# Patient Record
Sex: Female | Born: 1960 | Race: White | Hispanic: No | Marital: Married | Smoking: Never smoker
Health system: Southern US, Community
[De-identification: ages and names within clinical notes are randomized; demographics above are authoritative.]

## PROBLEM LIST (undated history)

## (undated) DIAGNOSIS — E042 Nontoxic multinodular goiter: Secondary | ICD-10-CM

## (undated) DIAGNOSIS — Z9889 Other specified postprocedural states: Secondary | ICD-10-CM

## (undated) DIAGNOSIS — Z8371 Family history of colonic polyps: Secondary | ICD-10-CM

## (undated) DIAGNOSIS — E079 Disorder of thyroid, unspecified: Secondary | ICD-10-CM

## (undated) DIAGNOSIS — K649 Unspecified hemorrhoids: Secondary | ICD-10-CM

## (undated) DIAGNOSIS — K5790 Diverticulosis of intestine, part unspecified, without perforation or abscess without bleeding: Secondary | ICD-10-CM

## (undated) DIAGNOSIS — F419 Anxiety disorder, unspecified: Secondary | ICD-10-CM

## (undated) DIAGNOSIS — Z86018 Personal history of other benign neoplasm: Secondary | ICD-10-CM

## (undated) DIAGNOSIS — K219 Gastro-esophageal reflux disease without esophagitis: Secondary | ICD-10-CM

## (undated) DIAGNOSIS — Z83719 Family history of colon polyps, unspecified: Secondary | ICD-10-CM

## (undated) HISTORY — DX: Gastro-esophageal reflux disease without esophagitis: K21.9

## (undated) HISTORY — DX: Disorder of thyroid, unspecified: E07.9

## (undated) HISTORY — DX: Unspecified hemorrhoids: K64.9

## (undated) HISTORY — DX: Other specified postprocedural states: Z98.890

## (undated) HISTORY — PX: TONSILLECTOMY: SUR1361

## (undated) HISTORY — DX: Nontoxic multinodular goiter: E04.2

## (undated) HISTORY — DX: Anxiety disorder, unspecified: F41.9

## (undated) HISTORY — PX: OTHER SURGICAL HISTORY: SHX169

## (undated) HISTORY — DX: Family history of colon polyps, unspecified: Z83.719

## (undated) HISTORY — DX: Other specified postprocedural states: Z86.018

## (undated) HISTORY — DX: Family history of colonic polyps: Z83.71

## (undated) HISTORY — DX: Diverticulosis of intestine, part unspecified, without perforation or abscess without bleeding: K57.90

---

## 1997-12-19 ENCOUNTER — Other Ambulatory Visit: Admission: RE | Admit: 1997-12-19 | Discharge: 1997-12-19 | Payer: Self-pay | Admitting: Obstetrics and Gynecology

## 1999-01-08 ENCOUNTER — Other Ambulatory Visit: Admission: RE | Admit: 1999-01-08 | Discharge: 1999-01-08 | Payer: Self-pay | Admitting: Obstetrics and Gynecology

## 2000-01-20 ENCOUNTER — Other Ambulatory Visit: Admission: RE | Admit: 2000-01-20 | Discharge: 2000-01-20 | Payer: Self-pay | Admitting: Obstetrics and Gynecology

## 2000-02-04 ENCOUNTER — Encounter: Admission: RE | Admit: 2000-02-04 | Discharge: 2000-02-04 | Payer: Self-pay | Admitting: Obstetrics and Gynecology

## 2000-02-04 ENCOUNTER — Encounter: Payer: Self-pay | Admitting: Obstetrics and Gynecology

## 2000-02-11 ENCOUNTER — Encounter: Payer: Self-pay | Admitting: Obstetrics and Gynecology

## 2000-02-11 ENCOUNTER — Encounter: Admission: RE | Admit: 2000-02-11 | Discharge: 2000-02-11 | Payer: Self-pay | Admitting: Obstetrics and Gynecology

## 2001-02-02 ENCOUNTER — Other Ambulatory Visit: Admission: RE | Admit: 2001-02-02 | Discharge: 2001-02-02 | Payer: Self-pay | Admitting: Obstetrics and Gynecology

## 2001-02-16 ENCOUNTER — Encounter: Payer: Self-pay | Admitting: Obstetrics and Gynecology

## 2001-02-16 ENCOUNTER — Encounter: Admission: RE | Admit: 2001-02-16 | Discharge: 2001-02-16 | Payer: Self-pay | Admitting: Obstetrics and Gynecology

## 2005-08-05 ENCOUNTER — Encounter: Admission: RE | Admit: 2005-08-05 | Discharge: 2005-08-05 | Payer: Self-pay | Admitting: Vascular Surgery

## 2006-08-09 ENCOUNTER — Encounter: Admission: RE | Admit: 2006-08-09 | Discharge: 2006-08-09 | Payer: Self-pay | Admitting: Obstetrics and Gynecology

## 2007-08-11 ENCOUNTER — Encounter: Admission: RE | Admit: 2007-08-11 | Discharge: 2007-08-11 | Payer: Self-pay | Admitting: Vascular Surgery

## 2008-08-15 ENCOUNTER — Encounter: Admission: RE | Admit: 2008-08-15 | Discharge: 2008-08-15 | Payer: Self-pay | Admitting: Sports Medicine

## 2009-09-05 ENCOUNTER — Encounter: Admission: RE | Admit: 2009-09-05 | Discharge: 2009-09-05 | Payer: Self-pay | Admitting: Obstetrics and Gynecology

## 2010-08-25 ENCOUNTER — Other Ambulatory Visit: Payer: Self-pay | Admitting: Obstetrics and Gynecology

## 2010-08-25 DIAGNOSIS — Z1231 Encounter for screening mammogram for malignant neoplasm of breast: Secondary | ICD-10-CM

## 2010-08-25 DIAGNOSIS — N6009 Solitary cyst of unspecified breast: Secondary | ICD-10-CM

## 2010-09-17 ENCOUNTER — Other Ambulatory Visit: Payer: Self-pay | Admitting: Obstetrics and Gynecology

## 2010-09-17 DIAGNOSIS — N6009 Solitary cyst of unspecified breast: Secondary | ICD-10-CM

## 2010-09-19 ENCOUNTER — Ambulatory Visit: Payer: Self-pay

## 2010-09-19 ENCOUNTER — Ambulatory Visit
Admission: RE | Admit: 2010-09-19 | Discharge: 2010-09-19 | Disposition: A | Discharge status: Home / Self Care | Payer: BC Managed Care – PPO | Source: Ambulatory Visit | Attending: Obstetrics and Gynecology | Admitting: Obstetrics and Gynecology

## 2010-09-19 DIAGNOSIS — N6009 Solitary cyst of unspecified breast: Secondary | ICD-10-CM

## 2011-09-28 ENCOUNTER — Other Ambulatory Visit: Payer: Self-pay | Admitting: Obstetrics and Gynecology

## 2011-09-28 DIAGNOSIS — Z1231 Encounter for screening mammogram for malignant neoplasm of breast: Secondary | ICD-10-CM

## 2011-10-14 ENCOUNTER — Ambulatory Visit
Admission: RE | Admit: 2011-10-14 | Discharge: 2011-10-14 | Disposition: A | Discharge status: Home / Self Care | Payer: BC Managed Care – PPO | Source: Ambulatory Visit | Attending: Obstetrics and Gynecology | Admitting: Obstetrics and Gynecology

## 2011-10-14 DIAGNOSIS — Z1231 Encounter for screening mammogram for malignant neoplasm of breast: Secondary | ICD-10-CM

## 2011-10-19 ENCOUNTER — Other Ambulatory Visit: Payer: Self-pay | Admitting: Obstetrics and Gynecology

## 2011-10-19 DIAGNOSIS — R928 Other abnormal and inconclusive findings on diagnostic imaging of breast: Secondary | ICD-10-CM

## 2011-10-20 ENCOUNTER — Ambulatory Visit
Admission: RE | Admit: 2011-10-20 | Discharge: 2011-10-20 | Disposition: A | Discharge status: Home / Self Care | Payer: BC Managed Care – PPO | Source: Ambulatory Visit | Attending: Obstetrics and Gynecology | Admitting: Obstetrics and Gynecology

## 2011-10-20 DIAGNOSIS — R928 Other abnormal and inconclusive findings on diagnostic imaging of breast: Secondary | ICD-10-CM

## 2012-09-29 ENCOUNTER — Other Ambulatory Visit: Payer: Self-pay

## 2012-09-29 DIAGNOSIS — Z1231 Encounter for screening mammogram for malignant neoplasm of breast: Secondary | ICD-10-CM

## 2012-10-24 ENCOUNTER — Ambulatory Visit
Admission: RE | Admit: 2012-10-24 | Discharge: 2012-10-24 | Disposition: A | Discharge status: Home / Self Care | Payer: 59 | Source: Ambulatory Visit

## 2012-10-24 DIAGNOSIS — Z1231 Encounter for screening mammogram for malignant neoplasm of breast: Secondary | ICD-10-CM

## 2013-06-15 HISTORY — PX: COLONOSCOPY: SHX174

## 2013-11-02 ENCOUNTER — Other Ambulatory Visit: Payer: Self-pay

## 2013-11-02 DIAGNOSIS — Z1231 Encounter for screening mammogram for malignant neoplasm of breast: Secondary | ICD-10-CM

## 2013-11-09 ENCOUNTER — Ambulatory Visit
Admission: RE | Admit: 2013-11-09 | Discharge: 2013-11-09 | Disposition: A | Discharge status: Home / Self Care | Payer: 59 | Source: Ambulatory Visit

## 2013-11-09 ENCOUNTER — Encounter (INDEPENDENT_AMBULATORY_CARE_PROVIDER_SITE_OTHER): Payer: Self-pay

## 2013-11-09 DIAGNOSIS — Z1231 Encounter for screening mammogram for malignant neoplasm of breast: Secondary | ICD-10-CM

## 2014-11-09 ENCOUNTER — Other Ambulatory Visit: Payer: Self-pay

## 2014-11-09 DIAGNOSIS — Z1231 Encounter for screening mammogram for malignant neoplasm of breast: Secondary | ICD-10-CM

## 2014-12-06 ENCOUNTER — Ambulatory Visit
Admission: RE | Admit: 2014-12-06 | Discharge: 2014-12-06 | Disposition: A | Discharge status: Home / Self Care | Payer: 59 | Source: Ambulatory Visit

## 2014-12-06 ENCOUNTER — Ambulatory Visit: Payer: Self-pay

## 2014-12-06 DIAGNOSIS — Z1231 Encounter for screening mammogram for malignant neoplasm of breast: Secondary | ICD-10-CM

## 2015-12-16 ENCOUNTER — Other Ambulatory Visit: Payer: Self-pay | Admitting: Obstetrics and Gynecology

## 2015-12-16 DIAGNOSIS — Z1231 Encounter for screening mammogram for malignant neoplasm of breast: Secondary | ICD-10-CM

## 2015-12-30 ENCOUNTER — Ambulatory Visit
Admission: RE | Admit: 2015-12-30 | Discharge: 2015-12-30 | Disposition: A | Discharge status: Home / Self Care | Payer: BC Managed Care – PPO | Source: Ambulatory Visit | Attending: Obstetrics and Gynecology | Admitting: Obstetrics and Gynecology

## 2015-12-30 DIAGNOSIS — Z1231 Encounter for screening mammogram for malignant neoplasm of breast: Secondary | ICD-10-CM

## 2016-11-30 ENCOUNTER — Other Ambulatory Visit: Payer: Self-pay | Admitting: Obstetrics and Gynecology

## 2016-11-30 DIAGNOSIS — Z1231 Encounter for screening mammogram for malignant neoplasm of breast: Secondary | ICD-10-CM

## 2016-12-31 ENCOUNTER — Ambulatory Visit: Payer: BC Managed Care – PPO

## 2017-01-01 ENCOUNTER — Other Ambulatory Visit: Payer: Self-pay | Admitting: Obstetrics and Gynecology

## 2017-01-01 DIAGNOSIS — N6320 Unspecified lump in the left breast, unspecified quadrant: Secondary | ICD-10-CM

## 2017-01-07 ENCOUNTER — Ambulatory Visit
Admission: RE | Admit: 2017-01-07 | Discharge: 2017-01-07 | Disposition: A | Discharge status: Home / Self Care | Payer: BC Managed Care – PPO | Source: Ambulatory Visit | Attending: Obstetrics and Gynecology | Admitting: Obstetrics and Gynecology

## 2017-01-07 DIAGNOSIS — N6320 Unspecified lump in the left breast, unspecified quadrant: Secondary | ICD-10-CM

## 2017-12-08 ENCOUNTER — Other Ambulatory Visit: Payer: Self-pay | Admitting: Obstetrics and Gynecology

## 2017-12-08 DIAGNOSIS — Z1231 Encounter for screening mammogram for malignant neoplasm of breast: Secondary | ICD-10-CM

## 2018-01-10 ENCOUNTER — Ambulatory Visit
Admission: RE | Admit: 2018-01-10 | Discharge: 2018-01-10 | Disposition: A | Discharge status: Home / Self Care | Payer: BC Managed Care – PPO | Source: Ambulatory Visit | Attending: Obstetrics and Gynecology | Admitting: Obstetrics and Gynecology

## 2018-01-10 DIAGNOSIS — Z1231 Encounter for screening mammogram for malignant neoplasm of breast: Secondary | ICD-10-CM

## 2018-09-16 ENCOUNTER — Encounter: Payer: Self-pay | Admitting: General Practice

## 2018-09-16 NOTE — Progress Notes (Signed)
Previous note entered in error.  Santa Genera, LCSW Clinical Social Worker Phone:  [phone]

## 2018-09-16 NOTE — Progress Notes (Deleted)
[facility] Psychosocial Distress Screening Clinical Social Work  Clinical Social Work was referred by distress screening protocol.  The patient scored a 7 on the Psychosocial Distress Thermometer which indicates moderate distress. Clinical Social Worker contacted patient by phone to assess for distress and other psychosocial needs. Unable to reach patient, left VM w information about Support Center and our contact information.  ON[facility] DISTRESS SCREENING 09/16/2018  Screening Type Initial Screening  Distress experienced in past week (1-10) 7  Practical problem type Work/school  Emotional problem type Adjusting to illness  Information Concerns Type Lack of info about diagnosis;Lack of info about treatment;Lack of info about complementary therapy choices;Lack of info about maintaining fitness    Clinical Social Worker follow up needed: No.  If yes, follow up plan:  Sallee Lange, LCSW   Santa Genera, LCSW Clinical Social Worker Phone:  [phone]

## 2018-11-16 ENCOUNTER — Other Ambulatory Visit: Payer: Self-pay | Admitting: Obstetrics and Gynecology

## 2018-11-16 DIAGNOSIS — Z1231 Encounter for screening mammogram for malignant neoplasm of breast: Secondary | ICD-10-CM

## 2018-11-24 ENCOUNTER — Encounter: Payer: Self-pay | Admitting: Gastroenterology

## 2018-11-28 ENCOUNTER — Encounter: Payer: Self-pay | Admitting: Gastroenterology

## 2018-12-08 ENCOUNTER — Ambulatory Visit: Payer: BC Managed Care – PPO | Admitting: *Deleted

## 2018-12-08 ENCOUNTER — Telehealth: Payer: Self-pay | Admitting: *Deleted

## 2018-12-08 ENCOUNTER — Other Ambulatory Visit: Payer: Self-pay

## 2018-12-08 VITALS — Ht 64.0 in | Wt 185.0 lb

## 2018-12-08 DIAGNOSIS — Z1211 Encounter for screening for malignant neoplasm of colon: Secondary | ICD-10-CM

## 2018-12-08 DIAGNOSIS — Z8371 Family history of colonic polyps: Secondary | ICD-10-CM

## 2018-12-08 MED ORDER — SUPREP BOWEL PREP KIT 17.5-3.13-1.6 GM/177ML PO SOLN: 1.0000 | Freq: Once | ORAL | 0 refills | Status: AC

## 2018-12-08 NOTE — Telephone Encounter (Signed)
Pt had a PV today with me- Her last colon was in 2015,  this was her FIRST  Colon per pt- no older reports in there HDA system- - pt had NO polyps- hems and tics ONLY - she has no [facility]- her brother has had colon polyps- - the report from 2015 states the colon was performed due to a hx of colon polyps, but pt NEVER had a colon prior to the 2015 colonoscopy- ? Need colon now- pt does not want to have a bill for a screening colon if she doesnt need the colon now.   Please advise, Thanks for your time Lelan Pons  Pre Visit

## 2018-12-08 NOTE — Progress Notes (Signed)
No egg or soy allergy known to patient  No issues with past sedation with any surgeries  or procedures, no intubation problems  No diet pills per patient No home 02 use per patient  No blood thinners per patient  Pt denies issues with constipation  No A fib or A flutter  EMMI video sent to pt's e mail   Pt verified name, DOB, address and insurance during PV today. Pt mailed instruction packet to included paper to complete and mail back to [facility] with addressed and stamped envelope, Emmi video, copy of consent form to read and not return, and instructions. Suprep $15  coupon mailed in packet. PV completed over the phone. Pt encouraged to call with questions or issues   Pt is aware that care partner will wait in the car during parking lot; if they feel like they will be too hot to wait in the car; they may wait in the lobby.  We want them to wear a mask (we do not have any that we can provide them), practice social distancing, and we will check their temperatures when they get here.  I did remind patient that their care partner needs to stay in the parking lot the entire time. Pt will wear mask into building  Pt had a colon 2015, this was her FIRST  Colon per pt- no older reports - pt had NO polyps- hems and tics- she has no [facility]- her brother has had colon polyps- ? Need colon now- TE to [facility]

## 2018-12-09 NOTE — Telephone Encounter (Signed)
I do remember her Strong family history of colonic polyps Lets do colonoscopy this year.   Thx RG

## 2018-12-12 ENCOUNTER — Encounter: Payer: Self-pay | Admitting: Gastroenterology

## 2018-12-12 NOTE — Telephone Encounter (Signed)
Patient called and given Dr.Gupta's recommendations.

## 2018-12-12 NOTE — Telephone Encounter (Signed)
Patient is following up on previous msg. °

## 2018-12-21 ENCOUNTER — Telehealth: Payer: Self-pay | Admitting: Gastroenterology

## 2018-12-21 NOTE — Telephone Encounter (Signed)
Spoke with patient regarding Covid-19 screening questions °Covid-19 Screening Questions: ° °Do you now or have you had a fever in the last 14 days? no ° °Do you have any respiratory symptoms of shortness of breath or cough now or in the last 14 days? no ° °Do you have any family members or close contacts with diagnosed or suspected Covid-19 in the past 14 days? no ° °Have you been tested for Covid-19 and found to be positive? Yes, negative ° °Pt made aware of that care partner may wait in the car or come up to the lobby during the procedure but will need to provide their own mask. °

## 2018-12-22 ENCOUNTER — Other Ambulatory Visit: Payer: Self-pay

## 2018-12-22 ENCOUNTER — Encounter: Payer: Self-pay | Admitting: Gastroenterology

## 2018-12-22 ENCOUNTER — Ambulatory Visit (AMBULATORY_SURGERY_CENTER): Payer: BC Managed Care – PPO | Admitting: Gastroenterology

## 2018-12-22 VITALS — BP 108/61 | HR 66 | Temp 98.3°F | Resp 12

## 2018-12-22 DIAGNOSIS — Z1211 Encounter for screening for malignant neoplasm of colon: Secondary | ICD-10-CM | POA: Diagnosis not present

## 2018-12-22 DIAGNOSIS — Z83719 Family history of colon polyps, unspecified: Secondary | ICD-10-CM

## 2018-12-22 DIAGNOSIS — Z8371 Family history of colonic polyps: Secondary | ICD-10-CM

## 2018-12-22 MED ORDER — SODIUM CHLORIDE 0.9 % IV SOLN: 500.0000 mL | Freq: Once | INTRAVENOUS | Status: DC

## 2018-12-22 NOTE — Patient Instructions (Signed)
Please read handouts provided. High fiber diet. Continue present medications. Use preparation H cream as needed for hemorrhoids.      YOU HAD AN ENDOSCOPIC PROCEDURE TODAY AT [facility]:   Refer to the procedure report that was given to you for any specific questions about what was found during the examination.  If the procedure report does not answer your questions, please call your gastroenterologist to clarify.  If you requested that your care partner not be given the details of your procedure findings, then the procedure report has been included in a sealed envelope for you to review at your convenience later.  YOU SHOULD EXPECT: Some feelings of bloating in the abdomen. Passage of more gas than usual.  Walking can help get rid of the air that was put into your GI tract during the procedure and reduce the bloating. If you had a lower endoscopy (such as a colonoscopy or flexible sigmoidoscopy) you may notice spotting of blood in your stool or on the toilet paper. If you underwent a bowel prep for your procedure, you may not have a normal bowel movement for a few days.  Please Note:  You might notice some irritation and congestion in your nose or some drainage.  This is from the oxygen used during your procedure.  There is no need for concern and it should clear up in a day or so.  SYMPTOMS TO REPORT IMMEDIATELY:   Following lower endoscopy (colonoscopy or flexible sigmoidoscopy):  Excessive amounts of blood in the stool  Significant tenderness or worsening of abdominal pains  Swelling of the abdomen that is new, acute  Fever of 100F or higher    For urgent or emergent issues, a gastroenterologist can be reached at any hour by calling [phone].   DIET:  We do recommend a small meal at first, but then you may proceed to your regular diet.  Drink plenty of fluids but you should avoid alcoholic beverages for 24 hours.  ACTIVITY:  You should plan to take it easy  for the rest of today and you should NOT DRIVE or use heavy machinery until tomorrow (because of the sedation medicines used during the test).    FOLLOW UP: Our staff will call the number listed on your records 48-72 hours following your procedure to check on you and address any questions or concerns that you may have regarding the information given to you following your procedure. If we do not reach you, we will leave a message.  We will attempt to reach you two times.  During this call, we will ask if you have developed any symptoms of COVID 19. If you develop any symptoms (ie: fever, flu-like symptoms, shortness of breath, cough etc.) before then, please call [phone].  If you test positive for Covid 19 in the 2 weeks post procedure, please call and report this information to [place].    If any biopsies were taken you will be contacted by phone or by letter within the next 1-3 weeks.  Please call us at [phone] if you have not heard about the biopsies in 3 weeks.    SIGNATURES/CONFIDENTIALITY: You and/or your care partner have signed paperwork which will be entered into your electronic medical record.  These signatures attest to the fact that that the information above on your After Visit Summary has been reviewed and is understood.  Full responsibility of the confidentiality of this discharge information lies with you and/or your care-partner.

## 2018-12-22 NOTE — Progress Notes (Signed)
PT taken to PACU. Monitors in place. VSS. Report given to RN. 

## 2018-12-22 NOTE — Op Note (Signed)
[facility] Patient Name: Wyman SongsterLisa O'connell Procedure Date: 12/22/2018 9:18 AM MRN: [id number] Endoscopist: Lynann Bolognaajesh Katerina Zurn , MD Age: 58 Referring MD:  Date of Birth: [birth year] Gender: Female Account #: [id number] Procedure:                Colonoscopy Indications:              Screening for colorectal malignant neoplasm, H/O                            colonic polyps. Medicines:                Monitored Anesthesia Care Procedure:                Pre-Anesthesia Assessment:                           - Prior to the procedure, a History and Physical                            was performed, and patient medications and                            allergies were reviewed. The patient's tolerance of                            previous anesthesia was also reviewed. The risks                            and benefits of the procedure and the sedation                            options and risks were discussed with the patient.                            All questions were answered, and informed consent                            was obtained. Prior Anticoagulants: The patient has                            taken no previous anticoagulant or antiplatelet                            agents. ASA Grade Assessment: I - A normal, healthy                            patient. After reviewing the risks and benefits,                            the patient was deemed in satisfactory condition to                            undergo the procedure.  After obtaining informed consent, the colonoscope                            was passed under direct vision. Throughout the                            procedure, the patient's blood pressure, pulse, and                            oxygen saturations were monitored continuously. The                            Colonoscope was introduced through the anus and                            advanced to the the cecum, identified by           appendiceal orifice and ileocecal valve. The                            colonoscopy was performed without difficulty. The                            patient tolerated the procedure well. The quality                            of the bowel preparation was excellent. The                            ileocecal valve, appendiceal orifice, and rectum                            were photographed. Scope In: 9:24:42 AM Scope Out: 9:39:23 AM Scope Withdrawal Time: 0 hours 9 minutes 49 seconds  Total Procedure Duration: 0 hours 14 minutes 41 seconds  Findings:                 A few small-mouthed diverticula were found in the                            sigmoid colon and ascending colon.                           Non-bleeding external and internal hemorrhoids were                            found during retroflexion and during perianal exam.                            The hemorrhoids were moderate (Gd IV).                           The exam was otherwise without abnormality on                            direct and  retroflexion views. Complications:            No immediate complications. Estimated Blood Loss:     Estimated blood loss: none. Impression:               - Mild pancolonic diverticulosis.                           - Non-bleeding external and internal hemorrhoids.                           - Otherwise normal colonoscopy. Recommendation:           - Patient has a contact number available for                            emergencies. The signs and symptoms of potential                            delayed complications were discussed with the                            patient. Return to normal activities tomorrow.                            Written discharge instructions were provided to the                            patient.                           - High fiber diet.                           - Continue present medications.                           - Repeat colonoscopy in 10 years  for screening                            purposes. Earlier, if with any new problems or                            change in family history.                           - Return to GI clinic PRN.                           - If any problems with hemorrhoids, use Preparation                            H and let us know. I would have low threshold and                            referring her for surgical evaluation for possible  EUA/hemorrhoidectomy in case of any problems. Lynann Bolognaajesh Jareb Radoncic, MD 12/22/2018 9:46:01 AM This report has been signed electronically.

## 2018-12-26 ENCOUNTER — Telehealth: Payer: Self-pay

## 2018-12-26 NOTE — Telephone Encounter (Signed)
  Follow up Call-  Call back number 12/22/2018  Post procedure Call Back phone  # [phone]  Permission to leave phone message Yes  Some recent data might be hidden     Patient questions:  Do you have a fever, pain , or abdominal swelling? No. Pain Score  0 *  Have you tolerated food without any problems? Yes.    Have you been able to return to your normal activities? Yes.    Do you have any questions about your discharge instructions: Diet   No. Medications  No. Follow up visit  No.  Do you have questions or concerns about your Care? No.  Actions: * If pain score is 4 or above: No action needed, pain <4.  1. Have you developed a fever since your procedure? no  2.   Have you had an respiratory symptoms (SOB or cough) since your procedure? no  3.   Have you tested positive for COVID 19 since your procedure no  4.   Have you had any family members/close contacts diagnosed with the COVID 19 since your procedure?  no   If yes to any of these questions please route to Joylene John, RN and Alphonsa Gin, Therapist, sports.

## 2018-12-26 NOTE — Telephone Encounter (Signed)
NO ANSWER, MESSAGE LEFT FOR PATIENT. 

## 2019-01-16 ENCOUNTER — Ambulatory Visit
Admission: RE | Admit: 2019-01-16 | Discharge: 2019-01-16 | Disposition: A | Discharge status: Home / Self Care | Payer: BC Managed Care – PPO | Source: Ambulatory Visit | Attending: Obstetrics and Gynecology | Admitting: Obstetrics and Gynecology

## 2019-01-16 ENCOUNTER — Other Ambulatory Visit: Payer: Self-pay

## 2019-01-16 DIAGNOSIS — Z1231 Encounter for screening mammogram for malignant neoplasm of breast: Secondary | ICD-10-CM

## 2019-11-20 ENCOUNTER — Other Ambulatory Visit: Payer: Self-pay | Admitting: Obstetrics and Gynecology

## 2019-11-20 DIAGNOSIS — Z1231 Encounter for screening mammogram for malignant neoplasm of breast: Secondary | ICD-10-CM

## 2020-01-17 ENCOUNTER — Ambulatory Visit
Admission: RE | Admit: 2020-01-17 | Discharge: 2020-01-17 | Disposition: A | Discharge status: Home / Self Care | Payer: BC Managed Care – PPO | Source: Ambulatory Visit | Attending: Obstetrics and Gynecology | Admitting: Obstetrics and Gynecology

## 2020-01-17 ENCOUNTER — Other Ambulatory Visit: Payer: Self-pay

## 2020-01-17 DIAGNOSIS — Z1231 Encounter for screening mammogram for malignant neoplasm of breast: Secondary | ICD-10-CM

## 2020-12-27 ENCOUNTER — Other Ambulatory Visit: Payer: Self-pay | Admitting: Obstetrics and Gynecology

## 2020-12-27 DIAGNOSIS — Z1231 Encounter for screening mammogram for malignant neoplasm of breast: Secondary | ICD-10-CM

## 2021-01-24 ENCOUNTER — Ambulatory Visit
Admission: RE | Admit: 2021-01-24 | Discharge: 2021-01-24 | Disposition: A | Discharge status: Home / Self Care | Payer: BC Managed Care – PPO | Source: Ambulatory Visit | Attending: Obstetrics and Gynecology | Admitting: Obstetrics and Gynecology

## 2021-01-24 ENCOUNTER — Other Ambulatory Visit: Payer: Self-pay

## 2021-01-24 DIAGNOSIS — Z1231 Encounter for screening mammogram for malignant neoplasm of breast: Secondary | ICD-10-CM

## 2021-08-07 ENCOUNTER — Other Ambulatory Visit: Payer: Self-pay | Admitting: *Deleted

## 2021-11-13 ENCOUNTER — Ambulatory Visit: Payer: BC Managed Care – PPO | Admitting: Gastroenterology

## 2021-11-13 ENCOUNTER — Encounter: Payer: Self-pay | Admitting: Gastroenterology

## 2021-11-13 VITALS — BP 160/88 | HR 80 | Ht 64.0 in | Wt 183.0 lb

## 2021-11-13 DIAGNOSIS — R1013 Epigastric pain: Secondary | ICD-10-CM

## 2021-11-13 DIAGNOSIS — K219 Gastro-esophageal reflux disease without esophagitis: Secondary | ICD-10-CM

## 2021-11-13 MED ORDER — OMEPRAZOLE 20 MG PO CPDR: 20.0000 mg | DELAYED_RELEASE_CAPSULE | Freq: Every day | ORAL | 0 refills | Status: AC

## 2021-11-13 NOTE — Patient Instructions (Addendum)
If you are age 59 or older, your body mass index should be between 23-30. Your Body mass index is 31.41 kg/m. If this is out of the aforementioned range listed, please consider follow up with your Primary Care Provider.  If you are age 57 or younger, your body mass index should be between 19-25. Your Body mass index is 31.41 kg/m. If this is out of the aformentioned range listed, please consider follow up with your Primary Care Provider.   ________________________________________________________  The  GI providers would like to encourage you to use [facility] to communicate with providers for non-urgent requests or questions.  Due to long hold times on the telephone, sending your provider a message by [facility] may be a faster and more efficient way to get a response.  Please allow 48 business hours for a response.  Please remember that this is for non-urgent requests.  _______________________________________________________  Bonita Quin have been given an order for ultrasound to take to [facility] to personally schedule in person.  We have sent the following medications to your pharmacy for you to pick up at your convenience: Omeprazole. 1 capsule daily for 2 weeks  Thank you,  Dr. Lynann Bologna

## 2021-11-13 NOTE — Progress Notes (Signed)
Chief Complaint: epi pain  Referring Provider:  Myrlene Broker, MD      ASSESSMENT AND PLAN;   #1. GERD  #2. Epi pain (better). Nl CMP  Plan: -Omeprazole 20mg  po QD x 2 weeks.  If better, would consider to continue. -[place] abdo ro r/o GB etiology ([place]) -If still with problems and fails trial of PPIs, then EGD. She will call if still with problems.  She has all our contact numbers.   HPI:    Valerie Craig is a 61 y.o. female    C/O epi pain-intermittent, episodic, lasting for 2 to 3 hours, mostly at night.  Does not wake her up. Describes this as more fullness.  Rare heartburn.  It occurs once in 2 to 3 weeks.  No symptoms in between. Could not identify any triggers.  Denies consistent problems with fatty foods or spicy foods.  No jaundice dark urine or pale stools  Had normal CMP.  She was about to cancel the appointment as she felt better.  Then decided to come.  No odynophagia or dysphagia.  Denies having any nausea or vomiting.  Has alternating diarrhea and constipation x "all my life".  Some abdominal bloating.  No weight loss.  Past GI work-up: Colonoscopy 12/22/2018 -Mild pancolonic diverticulosis -Nonbleeding internal/external hemorrhoids -Otherwise normal. -Repeat in 10 years.  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  Married to [place]. 2 children Past Medical History:  Diagnosis Date   Anxiety    Diverticulosis    Family hx colonic polyps    brother   GERD (gastroesophageal reflux disease)    PAST hx    Hemorrhoids    Multiple thyroid nodules    S/P resection of meningioma    with GAMMA knife to removed    Thyroid disease    not optimal thyroid values    Past Surgical History:  Procedure Laterality Date   COLONOSCOPY  2015   tics and hems    gamma knife     TONSILLECTOMY      Family History  Problem Relation Age of Onset   Breast cancer Mother 59    Colon polyps Brother    Colon cancer Neg Hx    Esophageal cancer Neg Hx    Rectal cancer Neg Hx    Stomach cancer Neg Hx     Social History   Tobacco Use   Smoking status: Never   Smokeless tobacco: Never  Vaping Use   Vaping Use: Never used  Substance Use Topics   Alcohol use: Not Currently   Drug use: Never    Current Outpatient Medications  Medication Sig Dispense Refill   Ascorbic Acid (VITAMIN C) 1000 MG tablet Take 1,000 mg by mouth daily.     Magnesium 100 MG CAPS 202.5 mg     Misc Natural Products (CORTISOL MANAGER PO) Take by mouth daily.     Multiple Vitamins-Minerals (ZINC PO) Take 16.5 mg by mouth daily.     Nutritional Supplements (VITAMIN D BOOSTER PO) 7,500 Units     OVER THE COUNTER MEDICATION Prednenolone     OVER THE COUNTER MEDICATION Metholatited vitamin b's     OVER THE COUNTER MEDICATION Bone support- vit C,K,D     progesterone (PROMETRIUM) 100 MG capsule      thyroid (ARMOUR) 32.5 MG tablet Take by mouth.     No current facility-administered medications for this visit.  Allergies  Allergen Reactions   Sulfa Antibiotics Anaphylaxis    Review of Systems:  Constitutional: Denies fever, chills, diaphoresis, appetite change and fatigue.  HEENT: Denies photophobia, eye pain, redness, hearing loss, ear pain, congestion, sore throat, rhinorrhea, sneezing, mouth sores, neck pain, neck stiffness and tinnitus.   Respiratory: Denies SOB, DOE, cough, chest tightness,  and wheezing.   Cardiovascular: Denies chest pain, palpitations and leg swelling.  Genitourinary: Denies dysuria, urgency, frequency, hematuria, flank pain and difficulty urinating.  Musculoskeletal: Denies myalgias, back pain, joint swelling, arthralgias and gait problem.  Skin: No rash.  Neurological: Denies dizziness, seizures, syncope, weakness, light-headedness, numbness and headaches.  Hematological: Denies adenopathy. Easy bruising, personal or family bleeding history   Psychiatric/Behavioral: No anxiety or depression     Physical Exam:    BP (!) 160/88   Pulse 80   Ht 5\' 4"  (1.626 m)   Wt 183 lb (83 kg)   LMP 12/27/2017   SpO2 100%   BMI 31.41 kg/m  Wt Readings from Last 3 Encounters:  11/13/21 183 lb (83 kg)  12/08/18 185 lb (83.9 kg)   Constitutional:  Well-developed, in no acute distress. Psychiatric: Normal mood and affect. Behavior is normal. HEENT: Pupils normal.  Conjunctivae are normal. No scleral icterus. Cardiovascular: Normal rate, regular rhythm. No edema Pulmonary/chest: Effort normal and breath sounds normal. No wheezing, rales or rhonchi. Abdominal: Soft, nondistended.  Minimal epigastric tenderness. Bowel sounds active throughout. There are no masses palpable. No hepatomegaly. Rectal: Deferred Neurological: Alert and oriented to person place and time. Skin: Skin is warm and dry. No rashes noted.      Carmell Austria, MD 11/13/2021, 8:38 AM  Cc: Myrlene Broker, MD

## 2021-11-20 ENCOUNTER — Telehealth: Payer: Self-pay

## 2021-11-20 NOTE — Telephone Encounter (Signed)
LVM  [place] from 11-15-2021 is negative for abnormalities. Everything looks goods.

## 2021-11-21 NOTE — Telephone Encounter (Signed)
Thank you :)

## 2021-11-21 NOTE — Telephone Encounter (Signed)
Patient was made aware of [place] results documented below.

## 2022-01-26 ENCOUNTER — Other Ambulatory Visit: Payer: Self-pay | Admitting: Obstetrics and Gynecology

## 2022-01-26 DIAGNOSIS — Z1231 Encounter for screening mammogram for malignant neoplasm of breast: Secondary | ICD-10-CM

## 2022-01-27 ENCOUNTER — Ambulatory Visit
Admission: RE | Admit: 2022-01-27 | Discharge: 2022-01-27 | Disposition: A | Discharge status: Home / Self Care | Payer: BC Managed Care – PPO | Source: Ambulatory Visit | Attending: Obstetrics and Gynecology | Admitting: Obstetrics and Gynecology

## 2022-01-27 DIAGNOSIS — Z1231 Encounter for screening mammogram for malignant neoplasm of breast: Secondary | ICD-10-CM

## 2022-10-29 IMAGING — MG MM DIGITAL SCREENING BILAT W/ TOMO AND CAD
8 series · 8 of 24 positions shown · non-contrast
Comparison: Previous exam(s).

CLINICAL DATA: Screening.

EXAM:
DIGITAL SCREENING BILATERAL MAMMOGRAM WITH TOMOSYNTHESIS AND CAD
TECHNIQUE: Bilateral screening digital craniocaudal and mediolateral oblique
mammograms were obtained. Bilateral screening digital breast
tomosynthesis was performed. The images were evaluated with
computer-aided detection.

[R CC synth-2D]
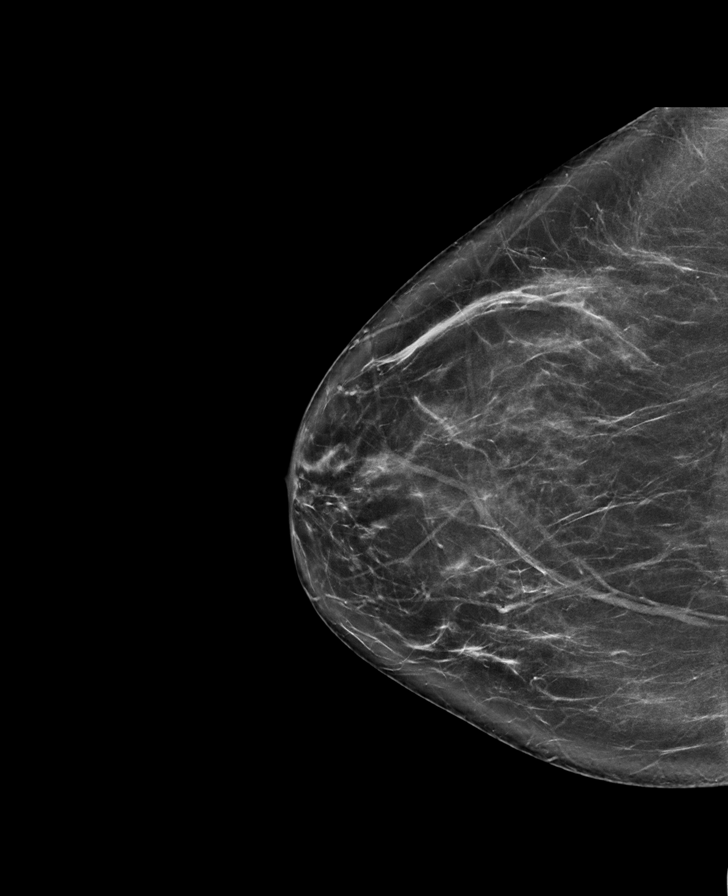

[R MLO synth-2D]
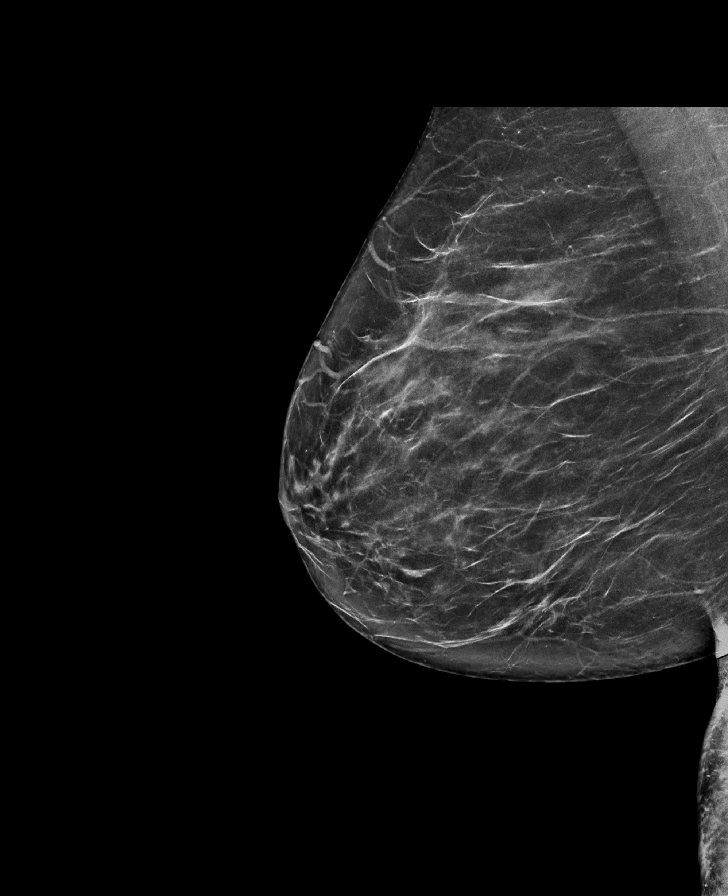

[L MLO synth-2D]
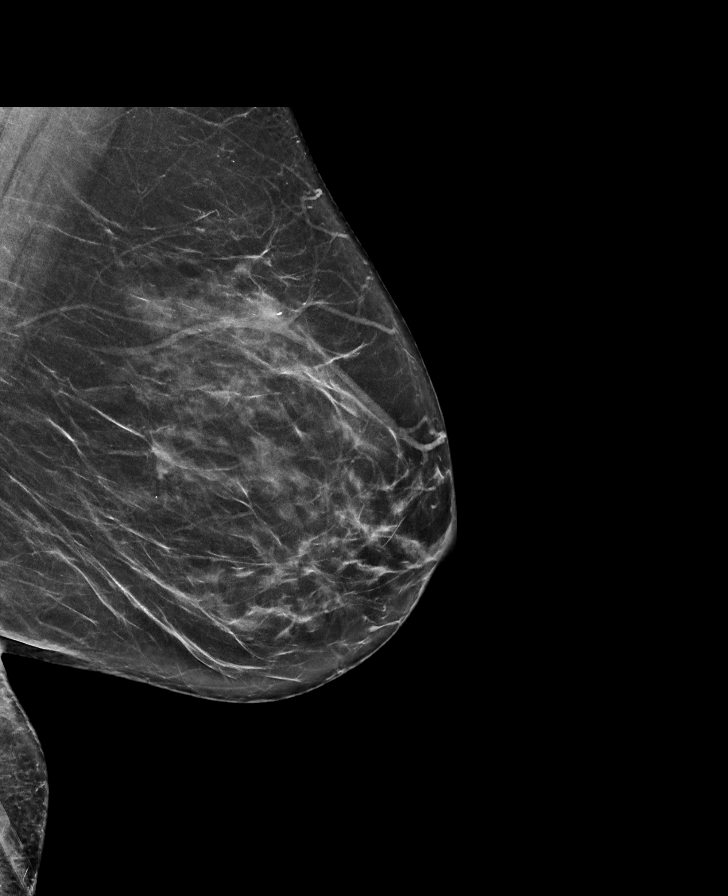

[L CC synth-2D]
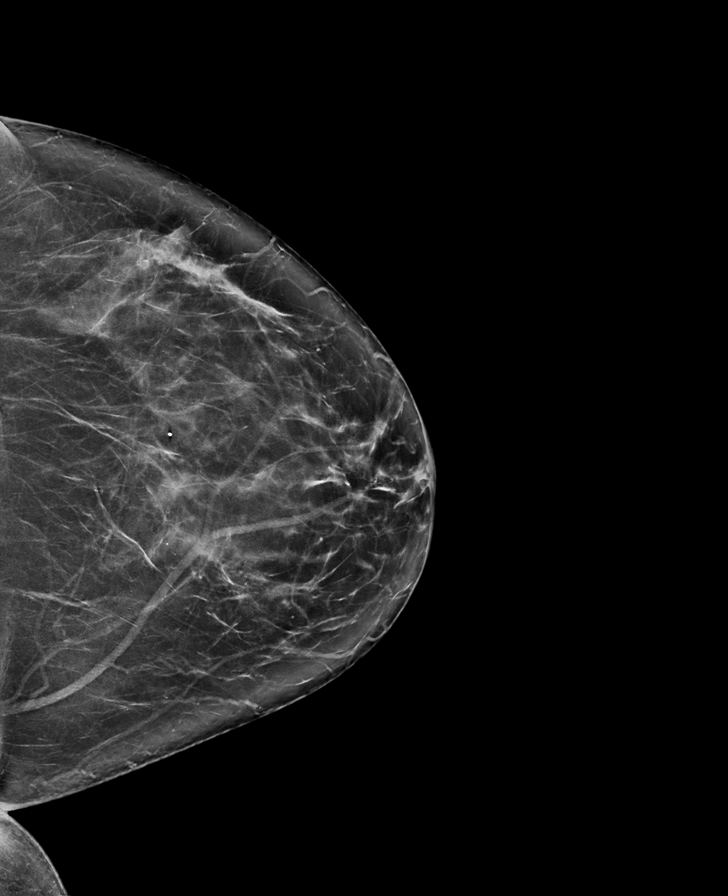

[R CC tomo · tomo slice 38/75.0]
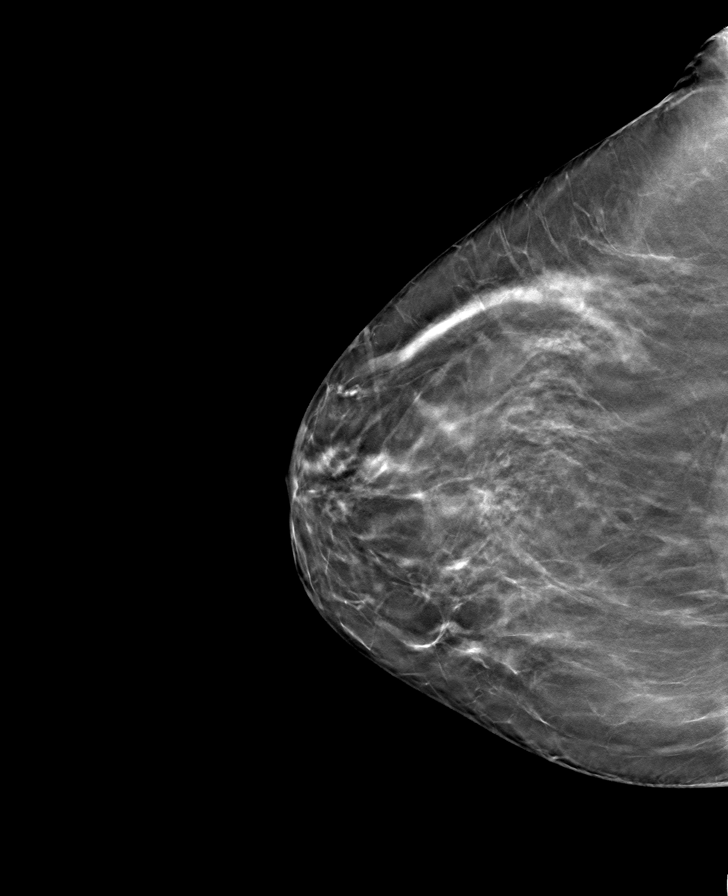

[L CC tomo · tomo slice 37/74.0]
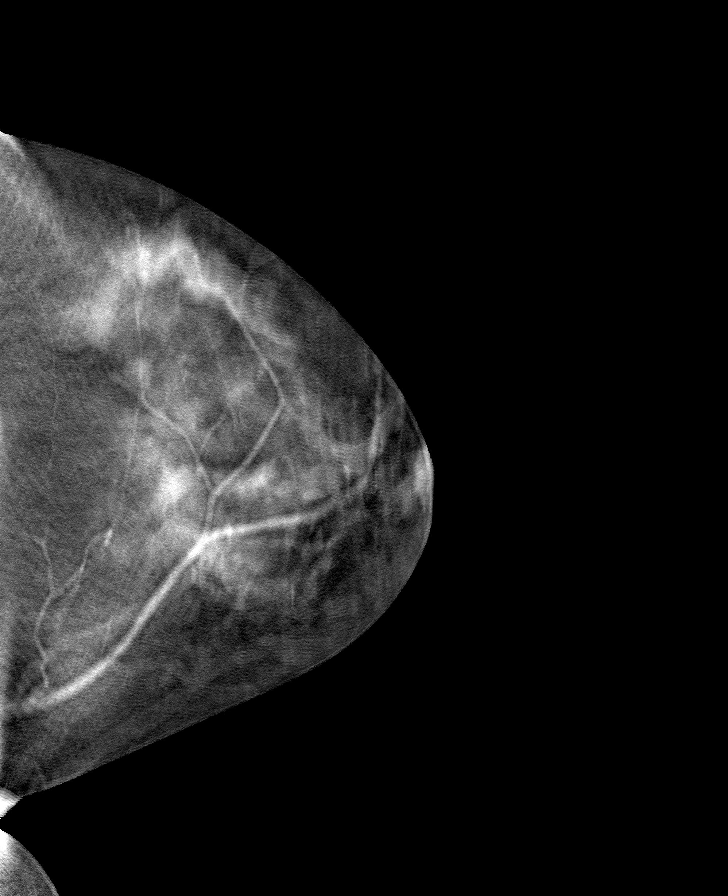

[R MLO tomo · tomo slice 37/74.0]
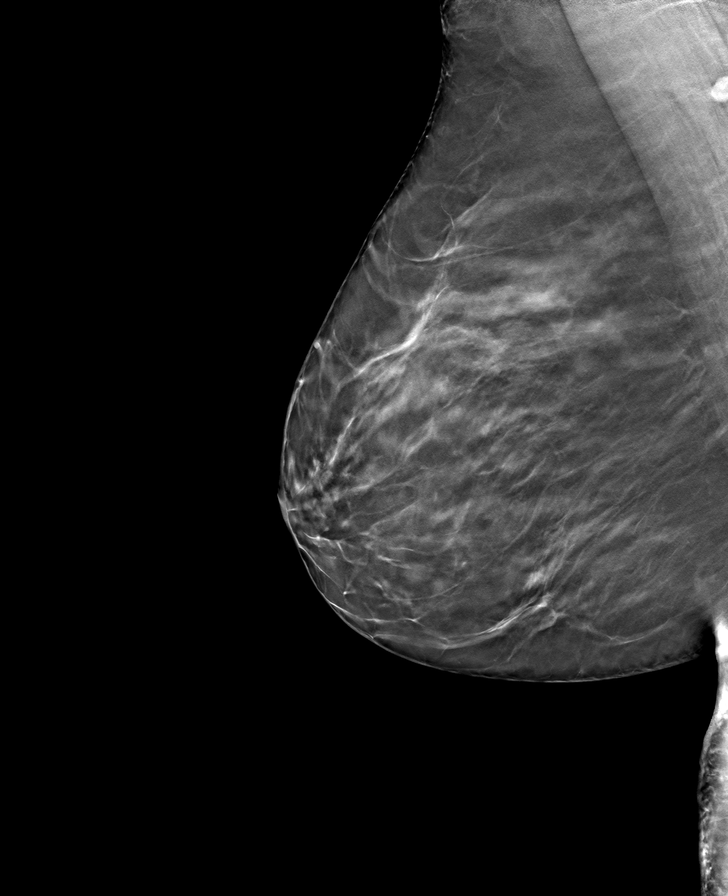

[L MLO tomo · tomo slice 37/72.0]
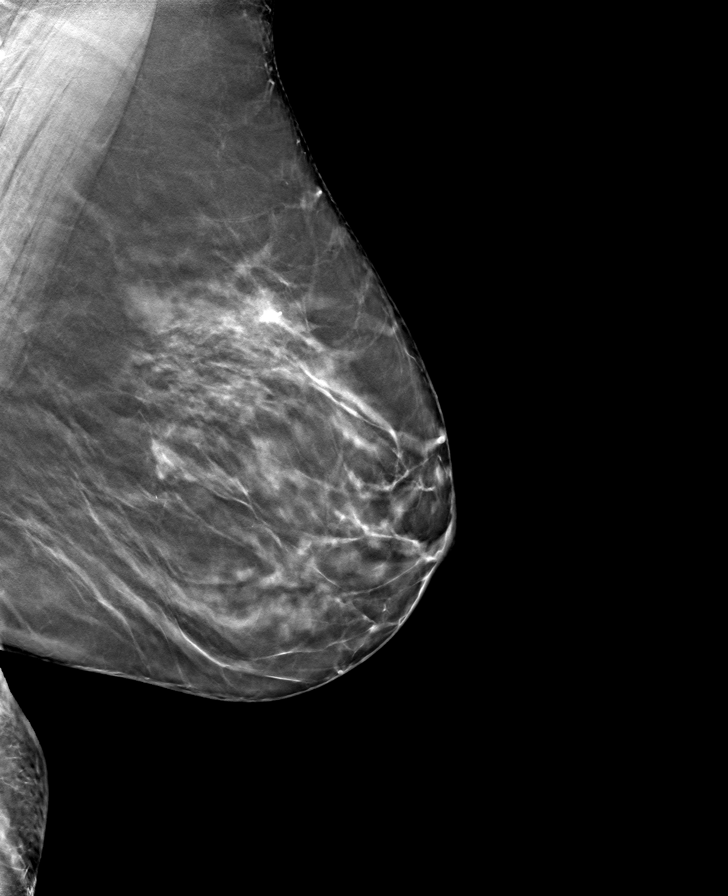

[8 of 24 positions shown; findings below may reference images not displayed]

ACR Breast Density Category b: There are scattered areas of
fibroglandular density.
FINDINGS: There are no findings suspicious for malignancy.
IMPRESSION: No mammographic evidence of malignancy. A result letter of this
screening mammogram will be mailed directly to the patient.

RECOMMENDATION:
Screening mammogram in one year. (Code:51-O-LD2)

BI-RADS CATEGORY  1: Negative.

## 2022-12-28 ENCOUNTER — Other Ambulatory Visit: Payer: Self-pay | Admitting: Obstetrics and Gynecology

## 2022-12-28 DIAGNOSIS — Z1231 Encounter for screening mammogram for malignant neoplasm of breast: Secondary | ICD-10-CM

## 2023-02-05 ENCOUNTER — Ambulatory Visit
Admission: RE | Admit: 2023-02-05 | Discharge: 2023-02-05 | Disposition: A | Discharge status: Home / Self Care | Payer: BC Managed Care – PPO | Source: Ambulatory Visit | Attending: Obstetrics and Gynecology | Admitting: Obstetrics and Gynecology

## 2023-02-05 DIAGNOSIS — Z1231 Encounter for screening mammogram for malignant neoplasm of breast: Secondary | ICD-10-CM

## 2024-03-17 ENCOUNTER — Other Ambulatory Visit: Payer: Self-pay | Admitting: Family Medicine

## 2024-03-17 DIAGNOSIS — N63 Unspecified lump in unspecified breast: Secondary | ICD-10-CM

## 2024-03-23 ENCOUNTER — Ambulatory Visit
Admission: RE | Admit: 2024-03-23 | Discharge: 2024-03-23 | Disposition: A | Payer: Self-pay | Source: Ambulatory Visit | Attending: Family Medicine | Admitting: Family Medicine

## 2024-03-23 DIAGNOSIS — N63 Unspecified lump in unspecified breast: Secondary | ICD-10-CM
